# Patient Record
Sex: Female | Born: 1955 | Race: White | Hispanic: No | Marital: Married | Smoking: Former smoker
Health system: Southern US, Community
[De-identification: ages and names within clinical notes are randomized; demographics above are authoritative.]

## PROBLEM LIST (undated history)

## (undated) DIAGNOSIS — F329 Major depressive disorder, single episode, unspecified: Secondary | ICD-10-CM

## (undated) DIAGNOSIS — F419 Anxiety disorder, unspecified: Secondary | ICD-10-CM

## (undated) DIAGNOSIS — F32A Depression, unspecified: Secondary | ICD-10-CM

## (undated) DIAGNOSIS — G4700 Insomnia, unspecified: Secondary | ICD-10-CM

## (undated) DIAGNOSIS — N950 Postmenopausal bleeding: Secondary | ICD-10-CM

## (undated) DIAGNOSIS — M8580 Other specified disorders of bone density and structure, unspecified site: Secondary | ICD-10-CM

## (undated) HISTORY — DX: Other specified disorders of bone density and structure, unspecified site: M85.80

## (undated) HISTORY — PX: HYSTEROSCOPY: SHX211

## (undated) HISTORY — DX: Postmenopausal bleeding: N95.0

## (undated) HISTORY — DX: Insomnia, unspecified: G47.00

## (undated) HISTORY — PX: TUBAL LIGATION: SHX77

## (undated) HISTORY — PX: AUGMENTATION MAMMAPLASTY: SUR837

## (undated) HISTORY — DX: Depression, unspecified: F32.A

## (undated) HISTORY — DX: Major depressive disorder, single episode, unspecified: F32.9

## (undated) HISTORY — DX: Anxiety disorder, unspecified: F41.9

---

## 1998-04-19 ENCOUNTER — Other Ambulatory Visit: Admission: RE | Admit: 1998-04-19 | Discharge: 1998-04-19 | Payer: Self-pay | Admitting: Obstetrics and Gynecology

## 2000-07-11 ENCOUNTER — Other Ambulatory Visit: Admission: RE | Admit: 2000-07-11 | Discharge: 2000-07-11 | Payer: Self-pay | Admitting: Obstetrics and Gynecology

## 2000-08-13 ENCOUNTER — Encounter (INDEPENDENT_AMBULATORY_CARE_PROVIDER_SITE_OTHER): Payer: Self-pay

## 2000-08-13 ENCOUNTER — Other Ambulatory Visit: Admission: RE | Admit: 2000-08-13 | Discharge: 2000-08-13 | Payer: Self-pay | Admitting: Obstetrics and Gynecology

## 2001-12-18 ENCOUNTER — Other Ambulatory Visit: Admission: RE | Admit: 2001-12-18 | Discharge: 2001-12-18 | Payer: Self-pay | Admitting: Obstetrics and Gynecology

## 2002-12-14 ENCOUNTER — Ambulatory Visit (HOSPITAL_BASED_OUTPATIENT_CLINIC_OR_DEPARTMENT_OTHER): Admission: RE | Admit: 2002-12-14 | Discharge: 2002-12-14 | Payer: Self-pay | Admitting: Obstetrics and Gynecology

## 2002-12-14 ENCOUNTER — Encounter (INDEPENDENT_AMBULATORY_CARE_PROVIDER_SITE_OTHER): Payer: Self-pay | Admitting: Specialist

## 2002-12-14 ENCOUNTER — Ambulatory Visit (HOSPITAL_COMMUNITY): Admission: RE | Admit: 2002-12-14 | Discharge: 2002-12-14 | Payer: Self-pay | Admitting: Obstetrics and Gynecology

## 2002-12-21 ENCOUNTER — Other Ambulatory Visit: Admission: RE | Admit: 2002-12-21 | Discharge: 2002-12-21 | Payer: Self-pay | Admitting: Obstetrics and Gynecology

## 2003-02-26 HISTORY — PX: AUGMENTATION MAMMAPLASTY: SUR837

## 2004-02-26 HISTORY — PX: TOTAL VAGINAL HYSTERECTOMY: SHX2548

## 2004-05-07 ENCOUNTER — Other Ambulatory Visit: Admission: RE | Admit: 2004-05-07 | Discharge: 2004-05-07 | Payer: Self-pay | Admitting: Gynecology

## 2004-06-06 ENCOUNTER — Encounter: Admission: RE | Admit: 2004-06-06 | Discharge: 2004-06-06 | Payer: Self-pay | Admitting: Gynecology

## 2004-06-12 ENCOUNTER — Observation Stay (HOSPITAL_COMMUNITY): Admission: AD | Admit: 2004-06-12 | Discharge: 2004-06-13 | Payer: Self-pay | Admitting: Gynecology

## 2004-06-12 ENCOUNTER — Encounter (INDEPENDENT_AMBULATORY_CARE_PROVIDER_SITE_OTHER): Payer: Self-pay | Admitting: *Deleted

## 2004-06-12 ENCOUNTER — Ambulatory Visit (HOSPITAL_BASED_OUTPATIENT_CLINIC_OR_DEPARTMENT_OTHER): Admission: RE | Admit: 2004-06-12 | Discharge: 2004-06-12 | Payer: Self-pay | Admitting: Gynecology

## 2005-04-02 ENCOUNTER — Encounter: Admission: RE | Admit: 2005-04-02 | Discharge: 2005-04-02 | Payer: Self-pay | Admitting: Gynecology

## 2005-05-15 ENCOUNTER — Other Ambulatory Visit: Admission: RE | Admit: 2005-05-15 | Discharge: 2005-05-15 | Payer: Self-pay | Admitting: Gynecology

## 2005-06-05 ENCOUNTER — Ambulatory Visit: Payer: Self-pay | Admitting: Hematology & Oncology

## 2006-06-05 ENCOUNTER — Other Ambulatory Visit: Admission: RE | Admit: 2006-06-05 | Discharge: 2006-06-05 | Payer: Self-pay | Admitting: Gynecology

## 2007-07-17 ENCOUNTER — Other Ambulatory Visit: Admission: RE | Admit: 2007-07-17 | Discharge: 2007-07-17 | Payer: Self-pay | Admitting: Gynecology

## 2007-08-03 ENCOUNTER — Encounter: Admission: RE | Admit: 2007-08-03 | Discharge: 2007-08-03 | Payer: Self-pay | Admitting: Gynecology

## 2008-08-05 ENCOUNTER — Other Ambulatory Visit: Admission: RE | Admit: 2008-08-05 | Discharge: 2008-08-05 | Payer: Self-pay | Admitting: Gynecology

## 2008-08-05 ENCOUNTER — Ambulatory Visit: Payer: Self-pay | Admitting: Gynecology

## 2008-08-05 ENCOUNTER — Encounter: Payer: Self-pay | Admitting: Gynecology

## 2008-08-23 ENCOUNTER — Ambulatory Visit: Payer: Self-pay | Admitting: Gynecology

## 2009-02-13 ENCOUNTER — Encounter: Admission: RE | Admit: 2009-02-13 | Discharge: 2009-02-13 | Payer: Self-pay | Admitting: Gynecology

## 2009-10-17 ENCOUNTER — Other Ambulatory Visit: Admission: RE | Admit: 2009-10-17 | Discharge: 2009-10-17 | Payer: Self-pay | Admitting: Gynecology

## 2009-10-17 ENCOUNTER — Ambulatory Visit: Payer: Self-pay | Admitting: Gynecology

## 2010-04-02 ENCOUNTER — Other Ambulatory Visit: Payer: Self-pay | Admitting: Gynecology

## 2010-04-03 ENCOUNTER — Ambulatory Visit (INDEPENDENT_AMBULATORY_CARE_PROVIDER_SITE_OTHER): Payer: Commercial Managed Care - PPO | Admitting: Gynecology

## 2010-04-03 DIAGNOSIS — N63 Unspecified lump in unspecified breast: Secondary | ICD-10-CM

## 2010-04-05 ENCOUNTER — Other Ambulatory Visit: Payer: Self-pay | Admitting: Gynecology

## 2010-04-05 DIAGNOSIS — N6312 Unspecified lump in the right breast, upper inner quadrant: Secondary | ICD-10-CM

## 2010-04-11 ENCOUNTER — Ambulatory Visit
Admission: RE | Admit: 2010-04-11 | Discharge: 2010-04-11 | Disposition: A | Discharge status: Home / Self Care | Payer: Commercial Managed Care - PPO | Source: Ambulatory Visit | Attending: Gynecology | Admitting: Gynecology

## 2010-04-11 ENCOUNTER — Other Ambulatory Visit: Payer: Self-pay | Admitting: Gynecology

## 2010-04-11 DIAGNOSIS — N6312 Unspecified lump in the right breast, upper inner quadrant: Secondary | ICD-10-CM

## 2010-04-26 ENCOUNTER — Other Ambulatory Visit (INDEPENDENT_AMBULATORY_CARE_PROVIDER_SITE_OTHER): Payer: Commercial Managed Care - PPO

## 2010-04-26 DIAGNOSIS — R630 Anorexia: Secondary | ICD-10-CM

## 2010-05-11 ENCOUNTER — Other Ambulatory Visit: Payer: Commercial Managed Care - PPO

## 2010-06-06 ENCOUNTER — Encounter: Payer: Commercial Managed Care - PPO | Attending: Gynecology | Admitting: *Deleted

## 2010-06-06 DIAGNOSIS — Z713 Dietary counseling and surveillance: Secondary | ICD-10-CM | POA: Insufficient documentation

## 2010-07-04 ENCOUNTER — Ambulatory Visit: Payer: Commercial Managed Care - PPO | Admitting: Gynecology

## 2010-07-09 ENCOUNTER — Ambulatory Visit (INDEPENDENT_AMBULATORY_CARE_PROVIDER_SITE_OTHER): Payer: Commercial Managed Care - PPO | Admitting: Gynecology

## 2010-07-09 DIAGNOSIS — G4700 Insomnia, unspecified: Secondary | ICD-10-CM

## 2010-07-09 DIAGNOSIS — R5381 Other malaise: Secondary | ICD-10-CM

## 2010-07-09 DIAGNOSIS — R636 Underweight: Secondary | ICD-10-CM

## 2010-07-13 NOTE — H&P (Signed)
[name], [name]               ACCOUNT NO.:  [id number]   MEDICAL RECORD NO.:  [id number]          PATIENT TYPE:  OBV   LOCATION:  0447                         FACILITY:  [facility], [place]   PHYSICIAN:  Juan H. Lily Peer, M.D.DATE OF BIRTH:  Oct 16, 1955   DATE OF ADMISSION:  06/12/2004  DATE OF DISCHARGE:                                HISTORY & PHYSICAL   Total days hospitalized: 24 hours.   HISTORY:  The patient is a 55 year old gravida 6 para 3 AB 3 who underwent a  transvaginal hysterectomy with bilateral salpingo-oophorectomy on June 12, 2004 secondary to postmenopausal bleeding and endometrial hyperplasia. The  patient had a blood loss from her surgery of 150 mL, IV fluids 1900 mL of  lactated Ringer's, urine output 350 mL and clear. The patient was kept on a  PCA pump overnight, along with a Foley catheter which was removed the  following morning. She was advanced from a clear liquid to a regular diet.  Her hemoglobin and hematocrit postoperative day #1 was 10.7 and 31.9  respectively with a platelet count of 255,000. The patient's diet was  advanced to a regular diet and by noon she had been tolerating a regular  diet, had showered, was ambulating, and was voiding well. She had great  bowel sounds and her abdomen was soft and nontender.   FINAL DIAGNOSES:  1.  Postmenopausal bleeding.  2.  Endometrial hyperplasia.   PROCEDURES PERFORMED:  Transvaginal hysterectomy with bilateral salpingo-  oophorectomy.   FINAL DISPOSITION AND FOLLOW-UP:  The patient was ready to be discharged  home 24 hours after her surgery, was doing well, ambulating, voiding well,  tolerating a regular diet, and had taken a shower, and was ready to be  discharged home. Pathology report pending at the time of this dictation. The  patient was given a prescription of Lortab 7.5/500 to take one p.o. q.4-6h.  p.r.n. pain, prescription for Reglan 10 mg to take one p.o. q.4-6h. p.r.n.  nausea, and iron  supplementation such as Nu-Iron 150 to take one p.o. daily.  She was scheduled to follow up in the office in 2-3 weeks for a  postoperative visit, and she will be placed on Climara transdermal estrogen  patch to apply 0.1 mg weekly.      JHF/MEDQ  D:  06/13/2004  T:  06/13/2004  Job:  [id number]

## 2010-07-13 NOTE — Op Note (Signed)
[name], CRAKER               ACCOUNT NO.:  [id number]   MEDICAL RECORD NO.:  [id number]          PATIENT TYPE:  AMB   LOCATION:  [facility]                         FACILITY:  [facility]   PHYSICIAN:  Juan H. Lily Peer, M.D.DATE OF BIRTH:  [birth year]   DATE OF PROCEDURE:  06/12/2004  DATE OF DISCHARGE:                                 OPERATIVE REPORT   SURGEON:  Juan H. Lily Peer, M.D.   FIRST ASSISTANT:  Douglass Rivers, MD   INDICATIONS FOR OPERATION:  A 55 year old gravida 6, para 3, AB 3 with  postmenopausal bleeding and history of endometrial hyperplasia.   POSTOPERATIVE DIAGNOSIS:  A 55 year old gravida 6, para 3, AB 3 with  postmenopausal bleeding and history of endometrial hyperplasia.   ANESTHESIA:  General endotracheal anesthesia.   PROCEDURE PERFORMED:  Transvaginal hysterectomy with bilateral salpingo-  oophorectomy.   FINDINGS:  Normal size, retroverted uterus, normal atrophic ovaries.   DESCRIPTION OF OPERATION:  After the patient was adequately counseled, she  was taken to the operating room where she underwent a successful general  endotracheal anesthesia.  She had received 2 g of cefotetan for prophylaxis  as well as she had PSA stockings for DVT prophylaxis.  She was placed in the  high lithotomy position.  The vagina and perineum were prepped and draped in  the usual sterile fashion.  A Foley catheter was inserted in effort to  monitor urinary output.  A short weighted speculum and two Deaver retractors  were placed in the vagina for exposure.  The anterior and posterior cervical  lip were grasped with Lahey thyroid clamps.  Xylocaine  2% with 1:100,000  epinephrine was infiltrated into the cervical vaginal fold for a total of 15  mL.  The cervical vaginal fold was incised in a circumferential fashion.  Posterior colpotomy was made, and a short weighted speculum was changed to a  long weighted billed speculum.  Both uterosacral ligaments were clamped,  cut, and  suture ligated with 0 Vicryl suture.  Anterior colpotomy was  accomplished, and remaining broad and cardinal ligaments were serially  clamped, cut, and suture ligated.  The posterior part of the uterus was  grasped with single-tooth tenaculum and everted in an effort to visualize  both pedicles which were respectively clamped, cut, and the uterus and  cervix were passed off the operative field.  Attention was placed to the  left tube and ovary which was grasped with a Babcock clamp and brought into  view, and left infundibular pelvic ligament was free tied with 0 Vicryl  suture followed by transfixation stitch of 0 Vicryl suture.  Similar  procedure was carried out on the contralateral side.  The posterior vaginal  peritoneum was base-balled from 3 to 9 o'clock position and following this,  after ascertaining adequate hemostasis, the vaginal cuff was closed with  interrupted sutures of 0 Vicryl suture.  The vagina was copiously  irrigated with normal saline solution.  Sponge count and needle count were  correct.  Iodoform gauze was placed into the vagina.  Blood loss for the  procedure was recorded as 150 mL, IV  fluid 1900 mL of lactated Ringer's, and  urine output 350 mL.      JHF/MEDQ  D:  06/12/2004  T:  06/12/2004  Job:  [id number]

## 2010-07-13 NOTE — Op Note (Signed)
NAME:  [name], [name]                         ACCOUNT NO.:  [id number]   MEDICAL RECORD NO.:  [id number]                   PATIENT TYPE:  AMB   LOCATION:  [facility]                                 FACILITY:  [facility]   PHYSICIAN:  Daniel L. Eda Paschal, M.D.           DATE OF BIRTH:  April 23, 1955   DATE OF PROCEDURE:  12/14/2002  DATE OF DISCHARGE:                                 OPERATIVE REPORT   PREOPERATIVE DIAGNOSIS:  Postmenopausal bleeding.   POSTOPERATIVE DIAGNOSES:  1. Postmenopausal bleeding.  2. Endometrial polyp.   OPERATION:  Hysteroscopy D&C.   SURGEON:  Daniel L. Eda Paschal, M.D.   ANESTHESIA:  General.   INDICATIONS:  The patient is a 55 year old, postmenopausal woman, who had  been on hormone replacement therapy but had used her patch unopposed with  Progestin for five months.  She came into the office.  Endometrial biopsy  was benign, but she was re-instructed to use it with progestin, and she  started to have persistent bleeding.  She was treated with Megace which  stopped the bleeding but as soon as she stopped the bleeding, she bled  again.  This happened twice.  She now enters the hospital for hysteroscopy  D&C with an endometrial polyp suspected.   FINDINGS:  External and vaginal was normal.  Cervix is clean.  Uterus is mid  position, normal size and shape with 1.5 degrees of descensus.  Adnexa are  palpably normal.  At the time of hysteroscopy, the patient had an  endometrial polyp on the posterior wall of the fundus.  It was less than 1  cm.  Once this had been removed, top of the fundus, tubal os, anterior and  posterior walls of the fundus, lower uterine segment, and endocervical canal  were free of any disease.   DESCRIPTION OF PROCEDURE:  After adequate general anesthesia, the patient  was placed in the dorsal lithotomy position, prepped and draped in the usual  sterile manner.  A single-tooth tenaculum was placed in the anterior lip of  the cervix.  The  cervix was dilated to a #21 Pratt dilator, and then a  hysteroscopic examination was done using the diagnostic hysteroscope, using  a camera for magnification, using 3% sorbitol to expand the intrauterine  cavity.  An excellent view was obtained.  There was a polypoid growth in the  posterior wall.  The hysteroscope was removed and using a sharp curette,  sharp curettage was done 360 degrees around with excision of the polyp.  The  patient was then re-hysteroscoped, and the polyp was gone.  Tissue was sent  to pathology for tissue diagnosis.  Estimated blood loss was less than 50 mL  with none replaced.  Fluid deficit was less than 100 mL.  The patient  tolerated the procedure well and left the operating room in satisfactory  condition.  Daniel L. Eda Paschal, M.D.    [name]  D:  12/14/2002  T:  12/14/2002  Job:  [id number]

## 2010-07-13 NOTE — H&P (Signed)
[name], [name]               ACCOUNT NO.:  [id number]   MEDICAL RECORD NO.:  [id number]          PATIENT TYPE:  AMB   LOCATION:  [facility]                         FACILITY:  [facility]   PHYSICIAN:  Juan H. Lily Peer, M.D.DATE OF BIRTH:  Jan 22, 1956   DATE OF ADMISSION:  06/12/2004  DATE OF DISCHARGE:                                HISTORY & PHYSICAL   CHIEF COMPLAINT:  Persistent postmenopausal bleeding, history of endometrial  hyperplasia as a result of unopposed estrogen.   HISTORY:  The patient is a 55 year old, gravida 6, para 3, AB 3, who was  seen in the office on May 07, 2004, for annual gynecological examination.  It appeared that the patient had been taking unopposed estrogen such as a  Vivelle patch 0.75 mg twice a week without adding the prometrium 100 mg  daily that had been prescribed.  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  The patient was to be  placed on Provera 10 mg 15 days of each month for three months with a  subsequent follow-up endometrial biopsy, but she continued to bleed.  She  was switched to Megace 20 mg b.i.d. and eventually to 40 mg b.i.d. and  continued to bleed, and so we will proceed with a transvaginal hysterectomy  with possible bilateral salpingo-oophorectomy.   PAST MEDICAL HISTORY:  The patient has had six pregnancies, three delivered  vaginally, three miscarriages.  She denies any allergies.  She has had a  history in the past of tubal ligation.  She has had D&C and hysteroscopy in  the past.  She has had a history of saline implants.  She is taking calcium  supplementation.   FAMILY HISTORY:  First cousin with breast cancer and father with lung  cancer.   PHYSICAL EXAMINATION:  GENERAL:  The patient weighs 114 pounds, 5 feet, 2.5  inches tall.  VITAL SIGNS:  Blood pressure 122/62.  HEENT:  Unremarkable.  NECK:   Supple.  Trachea midline.  No carotid bruits, no thyromegaly.  LUNGS:  Clear to auscultation without rhonchi or wheezes.  HEART:  Regular rate and rhythm, no murmurs or gallops.  BREAST EXAM:  Unremarkable, symmetrical in appearance, no skin  discoloration, no nipple inversion, no palpable masses or tenderness, no  supraclavicular or axillary lymphadenopathy.  ABDOMEN:  Soft, nontender, without rebound or guarding  PELVIC:  Bartholin, urethral, and Skene glands within normal limits.  Vagina  and cervix:  Normal without discharge.  Uterus:  Anteverted, normal size,  shape, and consistency.  Adnexa:  Without masses or tenderness.  RECTAL EXAM:  Hemoccult negative.   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  The patient continued to bleed despite different regimens of  progestational agent.  The patient is scheduled to undergo a transvaginal  hysterectomy with bilateral salpingo-oophorectomy.  The risks of the  operation were discussed including infection, although she will receive  prophylactic antibiotics,  the right for deep venous thrombosis, will have  PSA stockings for prophylaxis, the risk of trauma to the bladder,  intestines, blood vessels, rectum, and nearby structures were discussed.  In  the event of technical difficulty, an open laparotomy technique may need to  be utilized which may increase her hospitalization days, and also in the  event that the procedure is not able to be accomplished or completed  vaginally, an abdominal approach may need to be utilized, or  laparoscopically to free or remove the ovaries if not feasible or difficult  to remove vaginally.  Also, the risk for hemorrhage if she were need a blood  transfusion, she is fully aware of potential risks such as anaphylaxis,  hepatitis, and AIDS.  All of the above were discussed with the patient in   detail.  Literature and information from the [employer] of OB/GYN was  provided.  All questions were followed, and we will follow accordingly.   PLAN:  The patient was schedule for transvaginal hysterectomy with bilateral  salpingo-oophorectomy on Tuesday, June 12, 2004 at 1:30 p.m. at [facility].      JHF/MEDQ  D:  06/07/2004  T:  06/07/2004  Job:  [id number]

## 2010-10-22 ENCOUNTER — Ambulatory Visit (INDEPENDENT_AMBULATORY_CARE_PROVIDER_SITE_OTHER): Payer: Commercial Managed Care - PPO | Admitting: Gynecology

## 2010-10-22 ENCOUNTER — Encounter: Payer: Self-pay | Admitting: Gynecology

## 2010-10-22 ENCOUNTER — Other Ambulatory Visit (HOSPITAL_COMMUNITY)
Admission: RE | Admit: 2010-10-22 | Discharge: 2010-10-22 | Disposition: A | Discharge status: Home / Self Care | Payer: Commercial Managed Care - PPO | Source: Ambulatory Visit | Attending: Gynecology | Admitting: Gynecology

## 2010-10-22 DIAGNOSIS — F419 Anxiety disorder, unspecified: Secondary | ICD-10-CM

## 2010-10-22 DIAGNOSIS — F411 Generalized anxiety disorder: Secondary | ICD-10-CM

## 2010-10-22 DIAGNOSIS — J40 Bronchitis, not specified as acute or chronic: Secondary | ICD-10-CM | POA: Insufficient documentation

## 2010-10-22 DIAGNOSIS — M8580 Other specified disorders of bone density and structure, unspecified site: Secondary | ICD-10-CM

## 2010-10-22 DIAGNOSIS — Z01419 Encounter for gynecological examination (general) (routine) without abnormal findings: Secondary | ICD-10-CM

## 2010-10-22 DIAGNOSIS — Z131 Encounter for screening for diabetes mellitus: Secondary | ICD-10-CM

## 2010-10-22 DIAGNOSIS — Z1211 Encounter for screening for malignant neoplasm of colon: Secondary | ICD-10-CM

## 2010-10-22 DIAGNOSIS — N951 Menopausal and female climacteric states: Secondary | ICD-10-CM

## 2010-10-22 DIAGNOSIS — R634 Abnormal weight loss: Secondary | ICD-10-CM

## 2010-10-22 DIAGNOSIS — G4700 Insomnia, unspecified: Secondary | ICD-10-CM

## 2010-10-22 DIAGNOSIS — M949 Disorder of cartilage, unspecified: Secondary | ICD-10-CM

## 2010-10-22 DIAGNOSIS — R82998 Other abnormal findings in urine: Secondary | ICD-10-CM

## 2010-10-22 DIAGNOSIS — Z13220 Encounter for screening for lipoid disorders: Secondary | ICD-10-CM

## 2010-10-22 LAB — POC HEMOCCULT BLD/STL (OFFICE/1-CARD/DIAGNOSTIC): Fecal Occult Blood, POC: NEGATIVE

## 2010-10-22 MED ORDER — ESZOPICLONE 3 MG PO TABS: 3.0000 mg | ORAL_TABLET | Freq: Every day | ORAL | Status: DC

## 2010-10-22 MED ORDER — CEFDINIR 300 MG PO CAPS: 300.0000 mg | ORAL_CAPSULE | Freq: Two times a day (BID) | ORAL | Status: AC

## 2010-10-22 MED ORDER — ESTRATEST 1.25-2.5 MG PO TABS: 1.2500 | ORAL_TABLET | ORAL | Status: DC

## 2010-10-22 MED ORDER — ALPRAZOLAM 0.5 MG PO TABS: 0.5000 mg | ORAL_TABLET | Freq: Every evening | ORAL | Status: DC | PRN

## 2010-10-22 NOTE — Progress Notes (Signed)
[name] May 15, 1955 [id number]   History:    55 y.o.  for annual exam today. Patient had stated that she had been and an urgent care last week and was treated for a right lung pneumonia based on her chest x-ray findings. She had been placed on a Z-Pak for 5 days. She continues to have a nonproductive cough. Patient used to be a chronic smoker of 2 packs per day since she was 55 years old and stopped in 1999. Her brother was diagnosed with lung cancer as well as another brother with throat cancer both from being chronic smoker's. Review of her record indicating that patient was weighing 125 down to 118. Review of her records indicated in May of 2012 she had been placed on Megace DS 1 teaspoonful per day (5 cc) in an effort to improve her appetite and to gain weight. She had a normal TSH, competent is metabolic panel and CBC which were normal. She was weighing 1 pound 109 pounds and eventually increased to 125 pounds. Patient also has a history of osteopenia and review of her records indicated that she had been on Fosamax from 2006 2 2010 and is currently been on a drug-free holiday. She will need to schedule her next bone density study in the office. She is taking Caltrate plus one tablet twice a day with the addition of vitamin D 3 2000 units daily. Her last colonoscopy was less than 5 years ago which was normal. She has done well with her vasomotor symptoms on Estratest 1.25 mg daily. Patient had a TVH BSO in 2006 her last mammogram was in February 2012 which was normal and she states she does her monthly self breast examination. She suffers from insomnia at times and takes [place] on a when necessary basis and for anxiety she takes Xanax 0.5 mg when necessary.  Past medical history,surgical history, family history and social history were all reviewed and documented in the EPIC chart. ROS:  Was performed and pertinent positives and negatives are included in the history.  Exam: chaperone present Filed  Vitals:   10/22/10 1502  BP: 138/76   @WEIGHT @ Body mass index is 21.58 kg/(m^2).  General appearance : Well developed well nourished female. Skin grossly normal HEENT: Neck supple, trachea midline Lungs: Decreased breath sounds and upper lobes of the lungs with some rhonchi noted Heart: Regular rate and rhythm, no murmurs or gallops Breast:Examined in sitting and supine position were symmetrical in appearance, no palpable masses, to skin retraction, no nipple inversion, no nipple discharge and no axillary or supraclavicular lymphadenopathy Abdomen: no palpable masses or tenderness Pelvic  Ext/BUS/vagina  normal   Cervix : Absent Uterus : Absent,  vaginal cuff intact  Adnexa  Without masses or tenderness  Anus and perineum  normal   Rectovaginal  normal sphincter tone without palpated masses or tenderness             Hemoccult obtain results pending at time of this dictation     Assessment/Plan:  54 y.o. female for annual exam with evidence of bronchitis recently treated for pneumonia. She will be placed on Omnicef 300 mg one tablet twice a day for 10 days. Fecal occult blood testing was done today results pending at time of this dictation. She will schedule a bone density study here in the office the next couple weeks to follow the trend on her bone mineralization seen she's been on a drug-free holiday for the past 2 years from Fosamax. Prescription refill was given  for Estratest 1.25 mg daily for her vasomotor symptoms. Weekend to consider tapering her down next year. Once again the women's health initiative study were discussed as well as the risks benefits and pros and cons of hormone replacement therapy. She was given a prescription refill for the Lunesta 3 mg to take one by mouth each bedtime when necessary insomnia in a prescription refill for Xanax 0.5 mg to take one by mouth daily when necessary anxiety. Patient was instructed to continue her Caltrate plus twice a day as well as her  vitamin D 3 2000 units daily as well as to engage in weightbearing exercises 30-45 minutes 3-4 times a week for osteoporosis prevention. We will wait for the results of the bone density study and manage accordingly. She'll stop by the lab we'll check her CBC, cholesterol,, TSH, urinalysis, and random blood sugar. Pap smear was done today will notify her if there is any abnormality of any the above-mentioned tests otherwise will see her back in one year or when necessary. If her cough persisted despite being on the above antibiotic she will contact the office and we'll refer to the pulmonologist accordingly.    Ok Edwards MD, 10:26 PM 10/22/2010

## 2010-11-09 ENCOUNTER — Other Ambulatory Visit: Payer: Self-pay | Admitting: Gynecology

## 2010-12-02 ENCOUNTER — Other Ambulatory Visit: Payer: Self-pay | Admitting: Gynecology

## 2010-12-11 ENCOUNTER — Other Ambulatory Visit: Payer: Self-pay | Admitting: Gynecology

## 2010-12-12 ENCOUNTER — Ambulatory Visit (INDEPENDENT_AMBULATORY_CARE_PROVIDER_SITE_OTHER): Admission: RE | Admit: 2010-12-12 | Payer: Commercial Managed Care - PPO | Source: Ambulatory Visit

## 2010-12-12 DIAGNOSIS — M899 Disorder of bone, unspecified: Secondary | ICD-10-CM

## 2010-12-12 NOTE — Telephone Encounter (Signed)
RX CALLED INTO PHARMACY

## 2011-07-27 HISTORY — PX: BACK SURGERY: SHX140

## 2011-11-06 ENCOUNTER — Other Ambulatory Visit: Payer: Self-pay | Admitting: Gynecology

## 2011-11-06 ENCOUNTER — Telehealth: Payer: Self-pay | Admitting: *Deleted

## 2011-11-06 NOTE — Telephone Encounter (Signed)
Please check that the generic has not been discontinued. If it has been discontinued at customer care pharmacy she can pick up a 30 day supply for $20 or 90 day supply for $72 of the following:Triest 1.25 /methyltestosterone 1.25 and we can give her one year supply. Please let me know

## 2011-11-06 NOTE — Telephone Encounter (Signed)
Pharmacy faxed over a note stating that her estratest 1.25mg  has been discontinued. Please advise another RX for the patient. Thanks Sherrilyn Rist

## 2011-11-26 NOTE — Telephone Encounter (Signed)
Spoke with Pharmacy. They have the RX ready for the pt to pick up. KW

## 2011-12-27 ENCOUNTER — Other Ambulatory Visit: Payer: Self-pay | Admitting: Gynecology

## 2012-01-12 IMAGING — US US BREAST*R*
1 series · 4 of 4 positions shown · non-contrast
Comparison: 02/13/2009, 08/03/2007

CLINICAL DATA: Palpable area of concern right breast.  Due for
annual exam.

DIGITAL DIAGNOSTIC BILATERAL MAMMOGRAM WITH CAD AND RIGHT BREAST
ULTRASOUND:

[Series 1: us breast*right* · 4 of 4 slices shown]
[im 1/4]
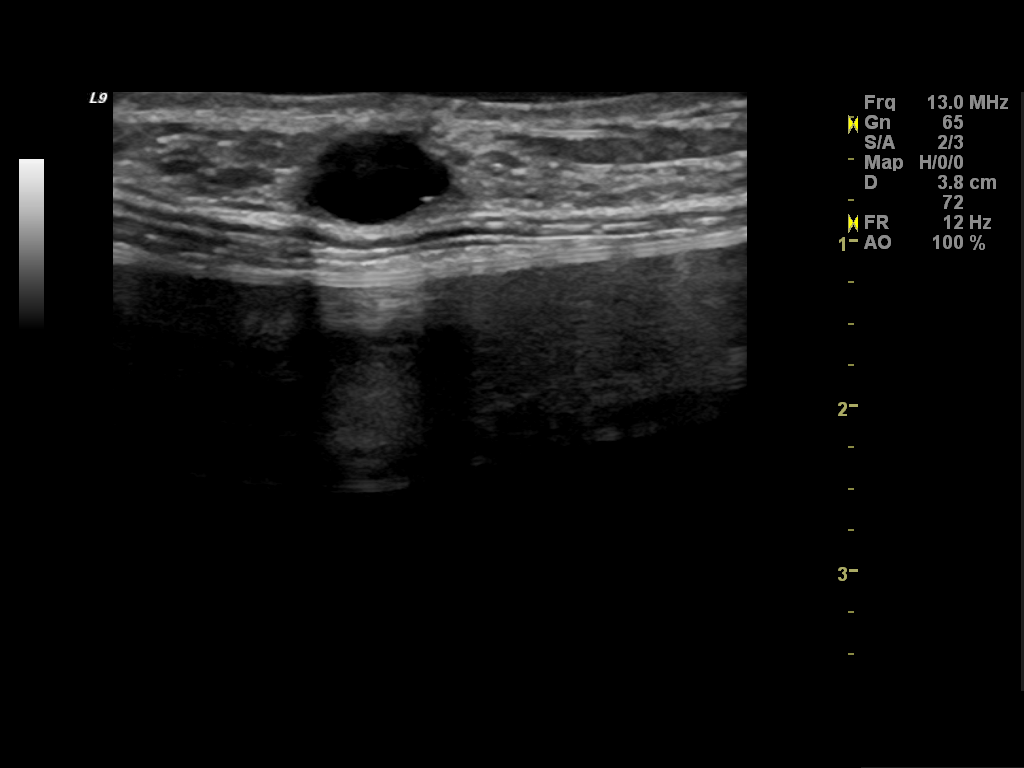
[im 2/4]
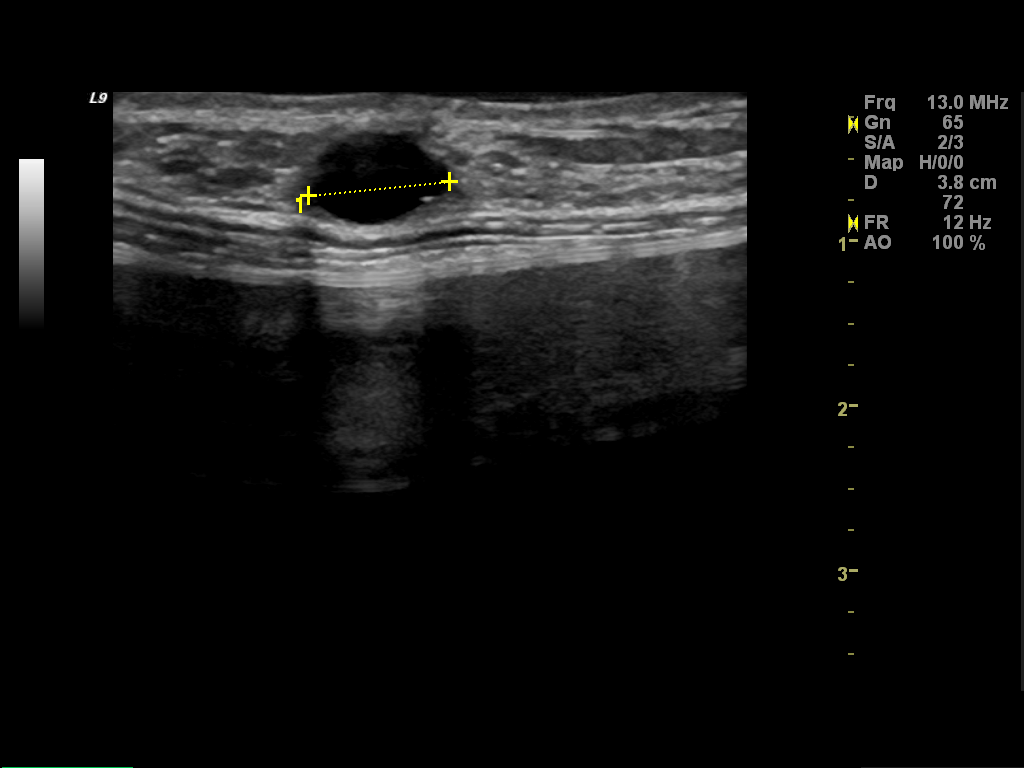
[im 3/4]
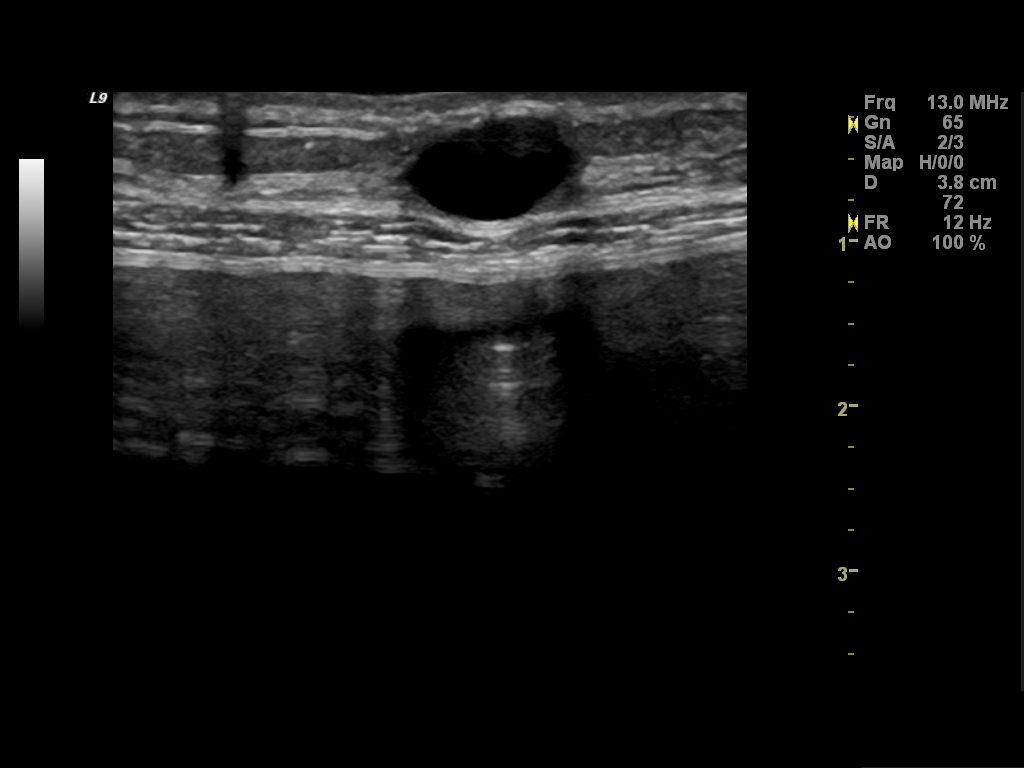
[im 4/4]
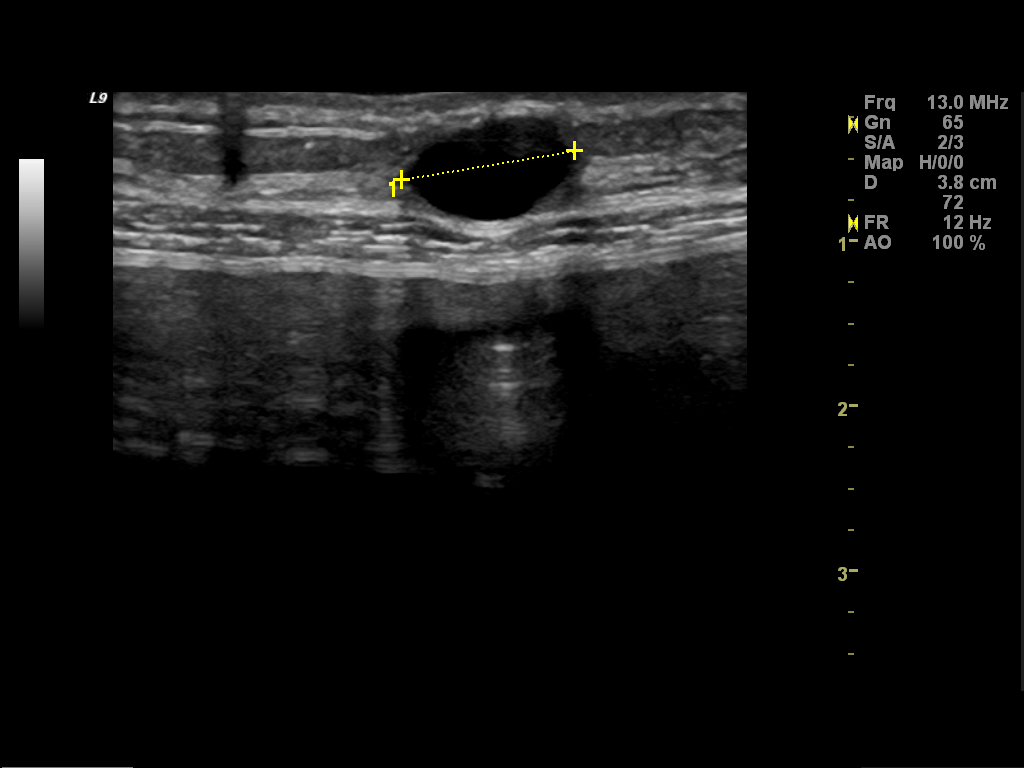

[4 of 4 positions shown; findings below may reference images not displayed]

FINDINGS: Bilateral subpectoral silicone breast implants appear
stable.  Overlying breast parenchyma is extremely dense.  There is
a vague rounded obscured area of added density immediately
subjacent to the skin marker.

Mammographic images were processed with CAD.

On physical exam, there is a discretely palpable mobile
superficially located 1 cm mass right breast one o'clock position 6
cm from the nipple.

Ultrasound is performed, showing a 1.1 cm simple cyst at the site
of palpable concern 1 o'clock position right breast.
IMPRESSION: No evidence of malignancy in either breast.  1.1 cm simple cyst 1
o'clock position right breast accounts for the palpable lump.
Bilateral screening mammogram in 1 year is recommended.

BI-RADS CATEGORY 2:  Benign finding(s).

## 2012-01-12 IMAGING — MG MM DIGITAL DIAGNOSTIC BILAT CAD
9 series · 9 of 9 positions shown · non-contrast
Comparison: 02/13/2009, 08/03/2007

CLINICAL DATA: Palpable area of concern right breast.  Due for
annual exam.

DIGITAL DIAGNOSTIC BILATERAL MAMMOGRAM WITH CAD AND RIGHT BREAST
ULTRASOUND:

[R CC]
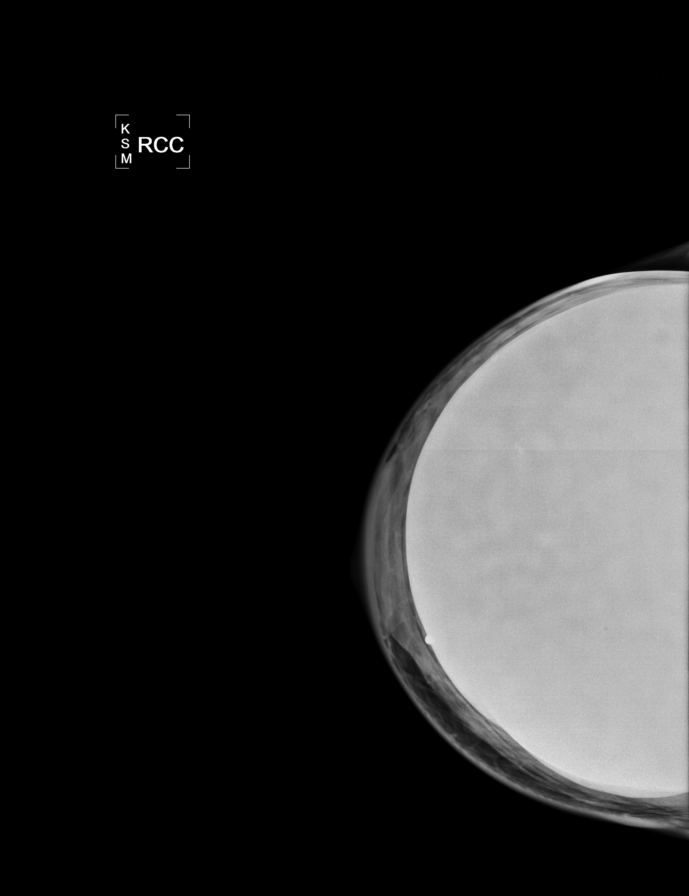

[R MLO]
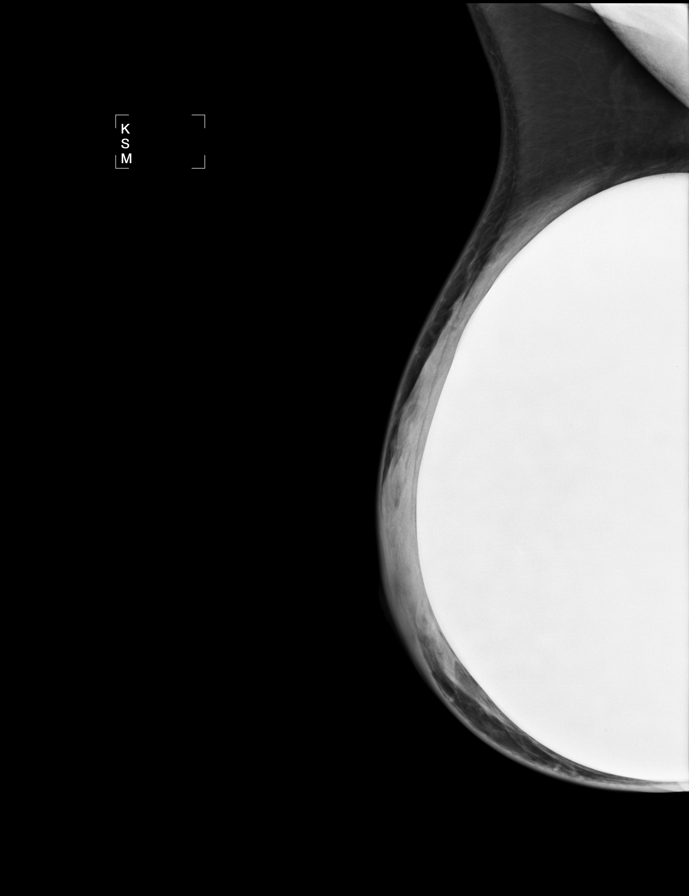

[R CCID]
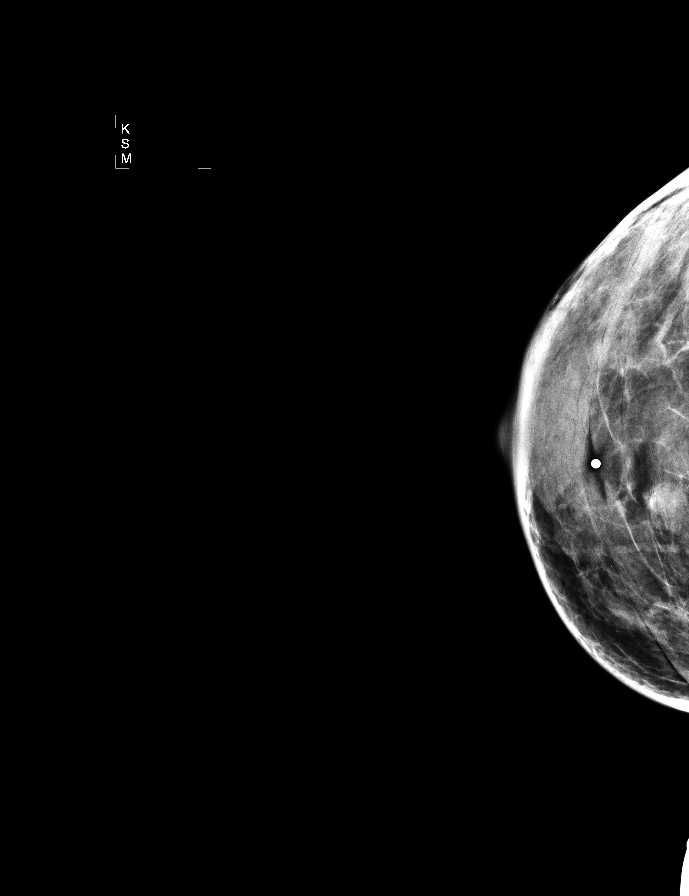

[R MLOID]
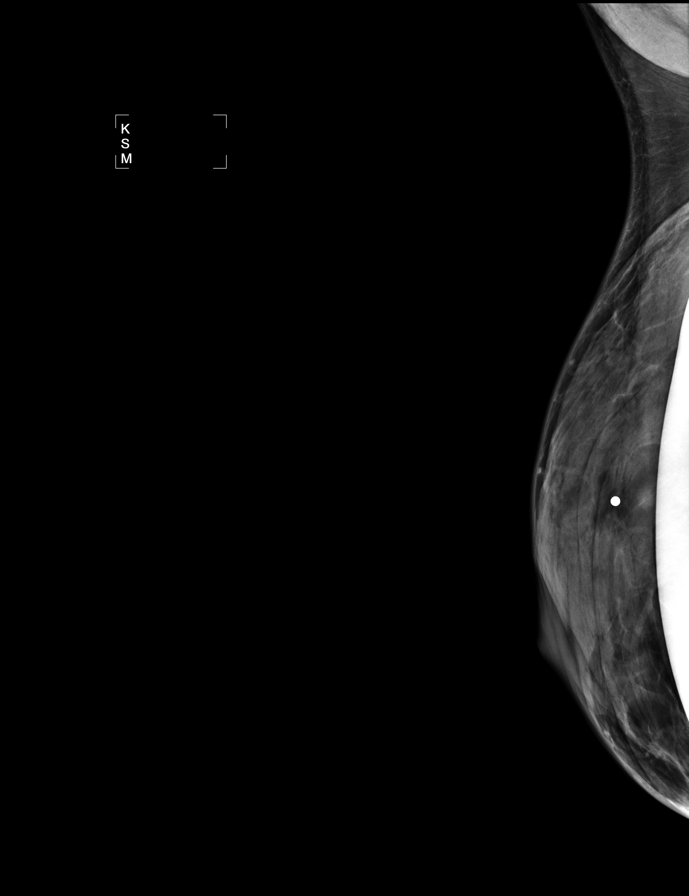

[L CC]
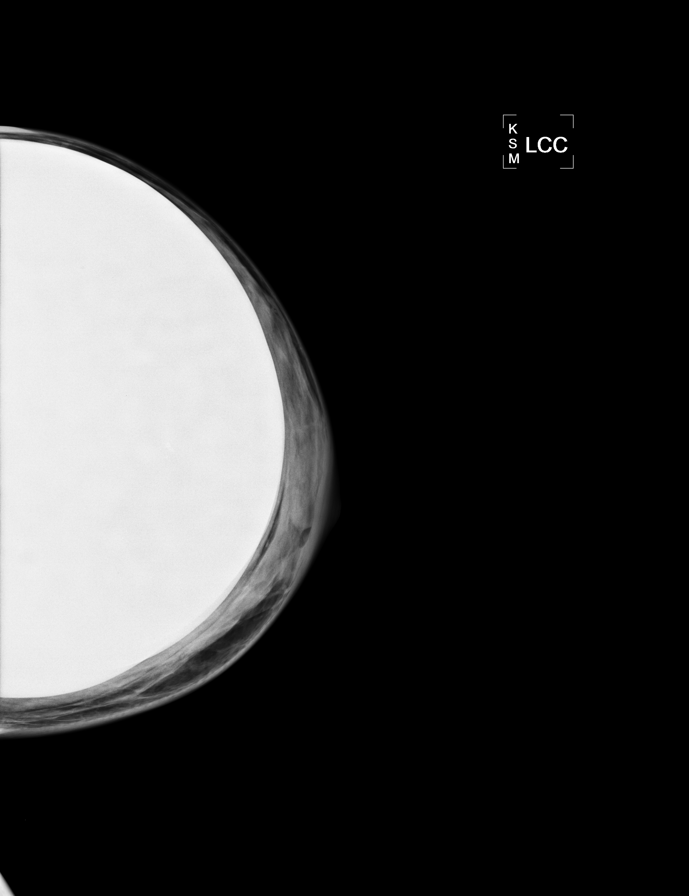

[L MLO (1 of 2)]
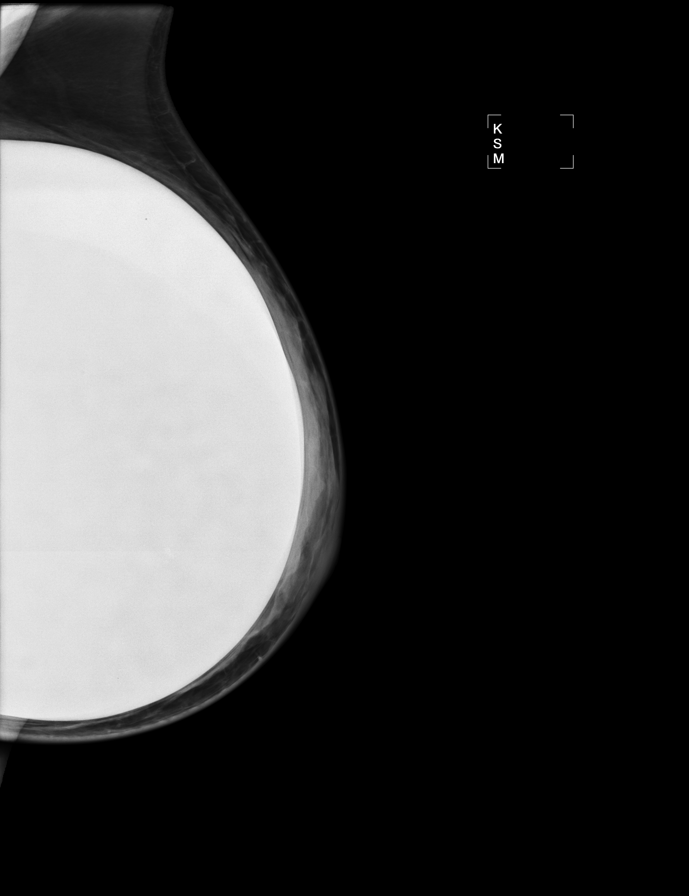

[L CCID]
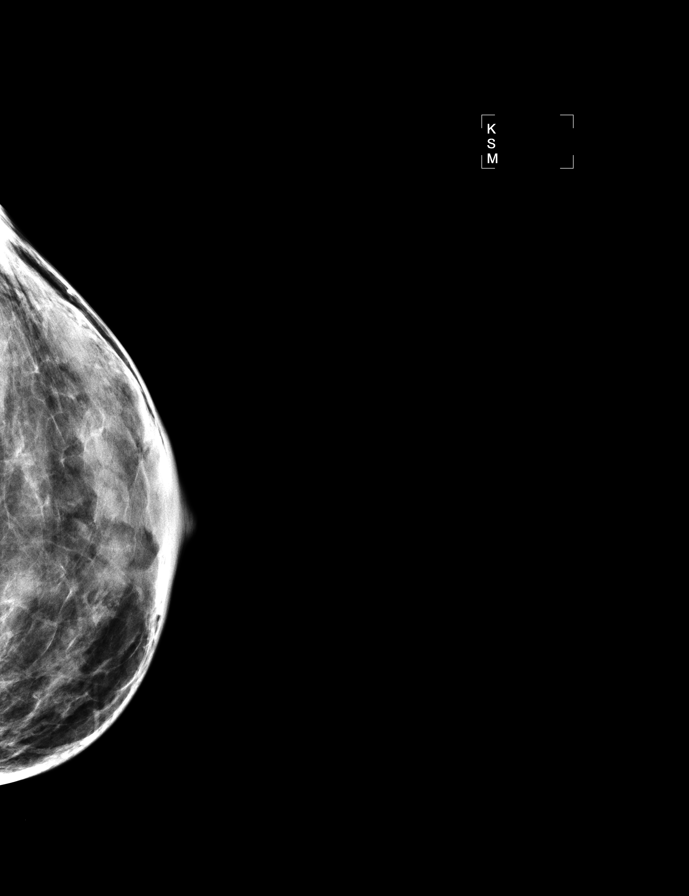

[L MLOID]
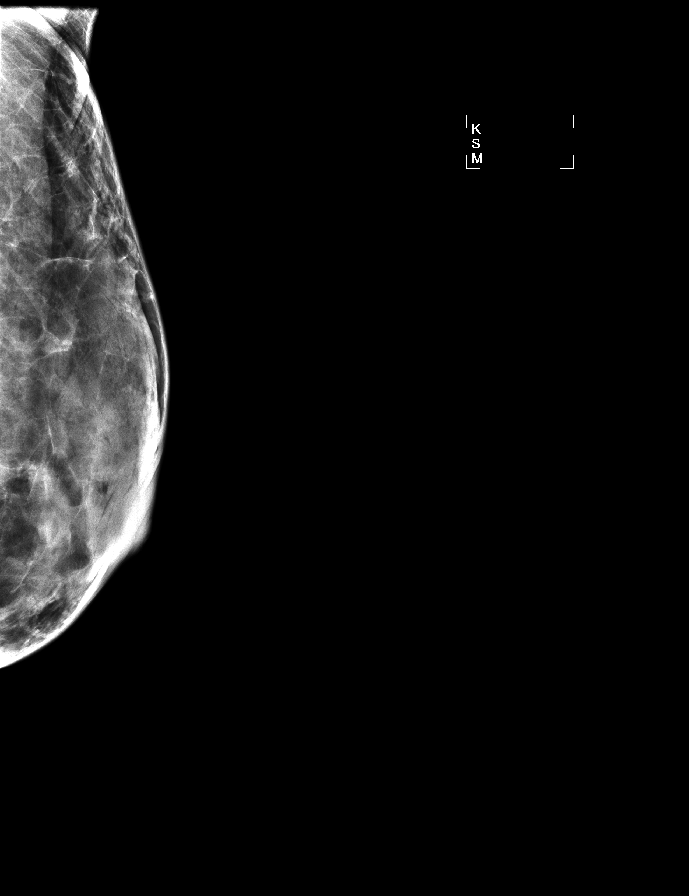

[L MLO (2 of 2)]
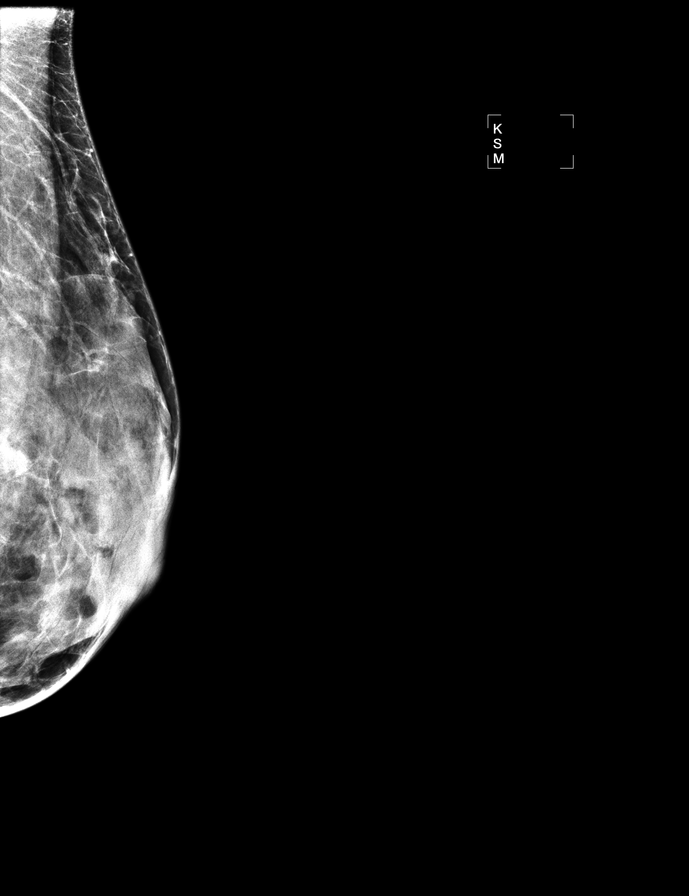

[9 of 9 positions shown; findings below may reference images not displayed]

FINDINGS: Bilateral subpectoral silicone breast implants appear
stable.  Overlying breast parenchyma is extremely dense.  There is
a vague rounded obscured area of added density immediately
subjacent to the skin marker.

Mammographic images were processed with CAD.

On physical exam, there is a discretely palpable mobile
superficially located 1 cm mass right breast one o'clock position 6
cm from the nipple.

Ultrasound is performed, showing a 1.1 cm simple cyst at the site
of palpable concern 1 o'clock position right breast.
IMPRESSION: No evidence of malignancy in either breast.  1.1 cm simple cyst 1
o'clock position right breast accounts for the palpable lump.
Bilateral screening mammogram in 1 year is recommended.

BI-RADS CATEGORY 2:  Benign finding(s).

## 2012-06-02 ENCOUNTER — Ambulatory Visit (INDEPENDENT_AMBULATORY_CARE_PROVIDER_SITE_OTHER): Payer: Commercial Managed Care - PPO | Admitting: Gynecology

## 2012-06-02 ENCOUNTER — Encounter: Payer: Self-pay | Admitting: Gynecology

## 2012-06-02 VITALS — BP 128/84 | Ht 61.75 in | Wt 129.0 lb

## 2012-06-02 DIAGNOSIS — F419 Anxiety disorder, unspecified: Secondary | ICD-10-CM

## 2012-06-02 DIAGNOSIS — Z1159 Encounter for screening for other viral diseases: Secondary | ICD-10-CM

## 2012-06-02 DIAGNOSIS — Z79890 Hormone replacement therapy (postmenopausal): Secondary | ICD-10-CM

## 2012-06-02 DIAGNOSIS — Z780 Asymptomatic menopausal state: Secondary | ICD-10-CM

## 2012-06-02 DIAGNOSIS — M949 Disorder of cartilage, unspecified: Secondary | ICD-10-CM

## 2012-06-02 DIAGNOSIS — M8580 Other specified disorders of bone density and structure, unspecified site: Secondary | ICD-10-CM

## 2012-06-02 DIAGNOSIS — Z23 Encounter for immunization: Secondary | ICD-10-CM

## 2012-06-02 DIAGNOSIS — F411 Generalized anxiety disorder: Secondary | ICD-10-CM

## 2012-06-02 DIAGNOSIS — Z01419 Encounter for gynecological examination (general) (routine) without abnormal findings: Secondary | ICD-10-CM

## 2012-06-02 LAB — CBC WITH DIFFERENTIAL/PLATELET
Basophils Absolute: 0 10*3/uL (ref 0.0–0.1)
Basophils Relative: 0 % (ref 0–1)
Hemoglobin: 13.1 g/dL (ref 12.0–15.0)
MCHC: 33.2 g/dL (ref 30.0–36.0)
Neutro Abs: 3.7 10*3/uL (ref 1.7–7.7)
Neutrophils Relative %: 58 % (ref 43–77)
RDW: 13.2 % (ref 11.5–15.5)

## 2012-06-02 LAB — COMPREHENSIVE METABOLIC PANEL
ALT: 53 U/L — ABNORMAL HIGH (ref 0–35)
AST: 33 U/L (ref 0–37)
Albumin: 4.6 g/dL (ref 3.5–5.2)
BUN: 16 mg/dL (ref 6–23)
Calcium: 10 mg/dL (ref 8.4–10.5)
Chloride: 105 mEq/L (ref 96–112)
Potassium: 4.4 mEq/L (ref 3.5–5.3)

## 2012-06-02 LAB — TSH: TSH: 1.999 u[IU]/mL (ref 0.350–4.500)

## 2012-06-02 MED ORDER — ALPRAZOLAM 0.5 MG PO TABS: ORAL_TABLET | ORAL | Status: DC

## 2012-06-02 MED ORDER — EST ESTROGENS-METHYLTEST 1.25-2.5 MG PO TABS: ORAL_TABLET | ORAL | Status: DC

## 2012-06-02 NOTE — Patient Instructions (Addendum)
Shingles Vaccine What You Need to Know WHAT IS SHINGLES?  Shingles is a painful skin rash, often with blisters. It is also called Herpes Zoster or just Zoster.  A shingles rash usually appears on one side of the face or body and lasts from 2 to 4 weeks. Its main symptom is pain, which can be quite severe. Other symptoms of shingles can include fever, headache, chills, and upset stomach. Very rarely, a shingles infection can lead to pneumonia, hearing problems, blindness, brain inflammation (encephalitis), or death.  For about 1 person in 5, severe pain can continue even after the rash clears up. This is called post-herpetic neuralgia.  Shingles is caused by the Varicella Zoster virus. This is the same virus that causes chickenpox. Only someone who has had a case of chickenpox or rarely, has gotten chickenpox vaccine, can get shingles. The virus stays in your body. It can reappear many years later to cause a case of shingles.  You cannot catch shingles from another person with shingles. However, a person who has never had chickenpox (or chickenpox vaccine) could get chickenpox from someone with shingles. This is not very common.  Shingles is far more common in people 50 and older than in younger people. It is also more common in people whose immune systems are weakened because of a disease such as cancer or drugs such as steroids or chemotherapy.  At least 1 million people get shingles per year in the [place]. SHINGLES VACCINE  A vaccine for shingles was licensed in 2006. In clinical trials, the vaccine reduced the risk of shingles by 50%. It can also reduce the pain in people who still get shingles after being vaccinated.  A single dose of shingles vaccine is recommended for adults 60 years of age and older. SOME PEOPLE SHOULD NOT GET SHINGLES VACCINE OR SHOULD WAIT A person should not get shingles vaccine if he or she:  Has ever had a life-threatening allergic reaction to gelatin, the  antibiotic neomycin, or any other component of shingles vaccine. Tell your caregiver if you have any severe allergies.  Has a weakened immune system because of current:  AIDS or another disease that affects the immune system.  Treatment with drugs that affect the immune system, such as prolonged use of high-dose steroids.  Cancer treatment, such as radiation or chemotherapy.  Cancer affecting the bone marrow or lymphatic system, such as leukemia or lymphoma.  Is pregnant, or might be pregnant. Women should not become pregnant until at least 4 weeks after getting shingles vaccine. Someone with a minor illness, such as a cold, may be vaccinated. Anyone with a moderate or severe acute illness should usually wait until he or she recovers before getting the vaccine. This includes anyone with a temperature of 101.3 F (38 C) or higher. WHAT ARE THE RISKS FROM SHINGLES VACCINE?  A vaccine, like any medicine, could possibly cause serious problems, such as severe allergic reactions. However, the risk of a vaccine causing serious harm, or death, is extremely small.  No serious problems have been identified with shingles vaccine. Mild Problems  Redness, soreness, swelling, or itching at the site of the injection (about 1 person in 3).  Headache (about 1 person in 70). Like all vaccines, shingles vaccine is being closely monitored for unusual or severe problems. WHAT IF THERE IS A MODERATE OR SEVERE REACTION? What should I look for? Any unusual condition, such as a severe allergic reaction or a high fever. If a severe allergic reaction   occurred, it would be within a few minutes to an hour after the shot. Signs of a serious allergic reaction can include difficulty breathing, weakness, hoarseness or wheezing, a fast heartbeat, hives, dizziness, paleness, or swelling of the throat. What should I do?  Call your caregiver, or get the person to a caregiver right away.  Tell the caregiver what  happened, the date and time it happened, and when the vaccination was given.  Ask the caregiver to report the reaction by filing a Vaccine Adverse Event Reporting System (VAERS) form. Or, you can file this report through the VAERS web site at www.vaers.LAgents.no or by calling 1-971-881-3948. VAERS does not provide medical advice. HOW CAN I LEARN MORE?  Ask your caregiver. He or she can give you the vaccine package insert or suggest other sources of information.  Contact the Centers for Disease Control and Prevention (CDC):  Call 212-073-1179 (1-800-CDC-INFO).  Visit the CDC website at PicCapture.uy CDC Shingles Vaccine VIS (12/01/07) Document Released: 12/09/2005 Document Revised: 05/06/2011 Document Reviewed: 12/01/2007 [facility] Patient Information 2013 [place], [place]. Tetanus, Diphtheria, Pertussis (Tdap) Vaccine What You Need to Know WHY GET VACCINATED? Tetanus, diphtheria and pertussis can be very serious diseases, even for adolescents and adults. Tdap vaccine can protect [place] from these diseases. TETANUS (Lockjaw) causes painful muscle tightening and stiffness, usually all over the body.  It can lead to tightening of muscles in the head and neck so you can't open your mouth, swallow, or sometimes even breathe. Tetanus kills about 1 out of 5 people who are infected. DIPHTHERIA can cause a thick coating to form in the back of the throat.  It can lead to breathing problems, paralysis, heart failure, and death. PERTUSSIS (Whooping Cough) causes severe coughing spells, which can cause difficulty breathing, vomiting and disturbed sleep.  It can also lead to weight loss, incontinence, and rib fractures. Up to 2 in 100 adolescents and 5 in 100 adults with pertussis are hospitalized or have complications, which could include pneumonia and death. These diseases are caused by bacteria. Diphtheria and pertussis are spread from person to person through coughing or sneezing. Tetanus enters  the body through cuts, scratches, or wounds. Before vaccines, the [place] saw as many as 200,000 cases a year of diphtheria and pertussis, and hundreds of cases of tetanus. Since vaccination began, tetanus and diphtheria have dropped by about 99% and pertussis by about 80%. TDAP VACCINE Tdap vaccine can protect adolescents and adults from tetanus, diphtheria, and pertussis. One dose of Tdap is routinely given at age 63 or 3. People who did not get Tdap at that age should get it as soon as possible. Tdap is especially important for health care professionals and anyone having close contact with a baby younger than 12 months. Pregnant women should get a dose of Tdap during every pregnancy, to protect the newborn from pertussis. Infants are most at risk for severe, life-threatening complications from pertussis. A similar vaccine, called Td, protects from tetanus and diphtheria, but not pertussis. A Td booster should be given every 10 years. Tdap may be given as one of these boosters if you have not already gotten a dose. Tdap may also be given after a severe cut or burn to prevent tetanus infection. Your doctor can give you more information. Tdap may safely be given at the same time as other vaccines. SOME PEOPLE SHOULD NOT GET THIS VACCINE  If you ever had a life-threatening allergic reaction after a dose of any tetanus, diphtheria, or pertussis containing  vaccine, OR if you have a severe allergy to any part of this vaccine, you should not get Tdap. Tell your doctor if you have any severe allergies.  If you had a coma, or long or multiple seizures within 7 days after a childhood dose of DTP or DTaP, you should not get Tdap, unless a cause other than the vaccine was found. You can still get Td.  Talk to your doctor if you:  have epilepsy or another nervous system problem,  had severe pain or swelling after any vaccine containing diphtheria, tetanus or pertussis,  ever had Guillain-Barr  Syndrome (GBS),  aren't feeling well on the day the shot is scheduled. RISKS OF A VACCINE REACTION With any medicine, including vaccines, there is a chance of side effects. These are usually mild and go away on their own, but serious reactions are also possible. Brief fainting spells can follow a vaccination, leading to injuries from falling. Sitting or lying down for about 15 minutes can help prevent these. Tell your doctor if you feel dizzy or light-headed, or have vision changes or ringing in the ears. Mild problems following Tdap (Did not interfere with activities)  Pain where the shot was given (about 3 in 4 adolescents or 2 in 3 adults)  Redness or swelling where the shot was given (about 1 person in 5)  Mild fever of at least 100.68F (up to about 1 in 25 adolescents or 1 in 100 adults)  Headache (about 3 or 4 people in 10)  Tiredness (about 1 person in 3 or 4)  Nausea, vomiting, diarrhea, stomach ache (up to 1 in 4 adolescents or 1 in 10 adults)  Chills, body aches, sore joints, rash, swollen glands (uncommon) Moderate problems following Tdap (Interfered with activities, but did not require medical attention)  Pain where the shot was given (about 1 in 5 adolescents or 1 in 100 adults)  Redness or swelling where the shot was given (up to about 1 in 16 adolescents or 1 in 25 adults)  Fever over 102F (about 1 in 100 adolescents or 1 in 250 adults)  Headache (about 3 in 20 adolescents or 1 in 10 adults)  Nausea, vomiting, diarrhea, stomach ache (up to 1 or 3 people in 100)  Swelling of the entire arm where the shot was given (up to about 3 in 100). Severe problems following Tdap (Unable to perform usual activities, required medical attention)  Swelling, severe pain, bleeding and redness in the arm where the shot was given (rare). A severe allergic reaction could occur after any vaccine (estimated less than 1 in a million doses). WHAT IF THERE IS A SERIOUS REACTION? What  should I look for?  Look for anything that concerns you, such as signs of a severe allergic reaction, very high fever, or behavior changes. Signs of a severe allergic reaction can include hives, swelling of the face and throat, difficulty breathing, a fast heartbeat, dizziness, and weakness. These would start a few minutes to a few hours after the vaccination. What should I do?  If you think it is a severe allergic reaction or other emergency that can't wait, call 9-1-1 or get the person to the nearest hospital. Otherwise, call your doctor.  Afterward, the reaction should be reported to the "Vaccine Adverse Event Reporting System" (VAERS). Your doctor might file this report, or you can do it yourself through the VAERS web site at www.vaers.SamedayNews.es, or by calling 913-238-6089. VAERS is only for reporting reactions. They do not give  medical advice.  THE NATIONAL VACCINE INJURY COMPENSATION PROGRAM The National Vaccine Injury Compensation Program (VICP) is a federal program that was created to compensate people who may have been injured by certain vaccines. Persons who believe they may have been injured by a vaccine can learn about the program and about filing a claim by calling 1-9416356901 or visiting the VICP website at SpiritualWord.at. HOW CAN I LEARN MORE?  Ask your doctor.  Call your local or state health department.  Contact the Centers for Disease Control and Prevention (CDC):  Call 737 293 6308 or visit CDC's website at PicCapture.uy CDC Tdap Vaccine VIS (07/04/11) Document Released: 08/13/2011 Document Reviewed: 08/13/2011 [facility] Patient Information 2013 [place], [place].

## 2012-06-02 NOTE — Progress Notes (Signed)
Called into pharmacy

## 2012-06-02 NOTE — Progress Notes (Signed)
[name] [birth year] [id number]   History:    57 y.o.  for annual gyn exam With no complaints today. Patient has a past history of osteopenia and review of her record indicated she had been on Fosamax from 2006 - 2010. She is currently taking calcium and vitamin D. She was a chronic smoker for many years and stopped in 1999. Patient has a prior history of transvaginal hysterectomy bilateral salpingo-oophorectomy. Patient with no prior history of abnormal Pap smears. Patient had a normal colonoscopy in 2007. She has bilateral silicon implants. Patient exam next 0.5 mg one tablet when necessary. She's also on Estratest 1.25 mg by mouth daily and has been off of it for several weeks and she states that she cannot tolerate the severity of the vasomotor symptoms affecting her quality of life. She has tried to taper off but has not been successful because of rebound symptoms. Patient's last bone density study was in 2012 with evidence of osteopenia. Patient mother with osteoporosis.  Past medical history,surgical history, family history and social history were all reviewed and documented in the EPIC chart.  Gynecologic History No LMP recorded. Patient has had a hysterectomy. Contraception: status post hysterectomy Last Pap: 2012. Results were: normal Last mammogram: 2012. Results were: normal  Obstetric History OB History   Grav Para Term Preterm Abortions TAB SAB Ect Mult Living   6 3   3  3   2      # Outc Date GA Lbr Len/2nd Wgt Sex Del Anes PTL Lv   1 PAR            2 PAR            3 PAR            4 SAB            5 SAB            6 SAB                ROS: A ROS was performed and pertinent positives and negatives are included in the history.  GENERAL: No fevers or chills. HEENT: No change in vision, no earache, sore throat or sinus congestion. NECK: No pain or stiffness. CARDIOVASCULAR: No chest pain or pressure. No palpitations. PULMONARY: No shortness of breath, cough or wheeze.  GASTROINTESTINAL: No abdominal pain, nausea, vomiting or diarrhea, melena or bright red blood per rectum. GENITOURINARY: No urinary frequency, urgency, hesitancy or dysuria. MUSCULOSKELETAL: No joint or muscle pain, no back pain, no recent trauma. DERMATOLOGIC: No rash, no itching, no lesions. ENDOCRINE: No polyuria, polydipsia, no heat or cold intolerance. No recent change in weight. HEMATOLOGICAL: No anemia or easy bruising or bleeding. NEUROLOGIC: No headache, seizures, numbness, tingling or weakness. PSYCHIATRIC: No depression, no loss of interest in normal activity or change in sleep pattern.     Exam: chaperone present  BP 128/84  Ht 5' 1.75" (1.568 m)  Wt 129 lb (58.514 kg)  BMI 23.8 kg/m2  Body mass index is 23.8 kg/(m^2).  General appearance : Well developed well nourished female. No acute distress HEENT: Neck supple, trachea midline, no carotid bruits, no thyroidmegaly Lungs: Clear to auscultation, no rhonchi or wheezes, or rib retractions  Heart: Regular rate and rhythm, no murmurs or gallops Breast:Examined in sitting and supine position were symmetrical in appearance, no palpable masses or tenderness,  no skin retraction, no nipple inversion, no nipple discharge, no skin discoloration, no axillary or supraclavicular lymphadenopathy Abdomen: no palpable masses  or tenderness, no rebound or guarding Extremities: no edema or skin discoloration or tenderness  Pelvic:  Bartholin, Urethra, Skene Glands: Within normal limits             Vagina: No gross lesions or discharge  Cervix: absent  Uterus  Absent  Adnexa  Without masses or tenderness  Anus and perineum  normal   Rectovaginal  normal sphincter tone without palpated masses or tenderness             Hemoccult cards provided     Assessment/Plan:  57 y.o. female for annual exam who has history of osteopenia currently on drug holiday for the past 4 years. She will schedule a bone density study in the next several weeks. She  will also schedule her mammogram which is overdue. She will receive the Tdap vaccine today. I've also given her prescription to obtain the shingles vaccine in her local pharmacy. Literature information was provided on both subject. The following labs were ordered today: Vitamin D, comprehensive metabolic panel, CBC, screening cholesterol, and urinalysis. Hemoccult cards provided for her to submit to the office for testing. No Pap smear done today the new guidelines were discussed. We discussed importance of calcium vitamin D with regular exercise for osteoporosis prevention. Patient last year had lumbar surgery for herniated disc.    Ok Edwards MD, 3:22 PM 06/02/2012

## 2012-06-02 NOTE — Addendum Note (Signed)
Addended by: Bertram Savin A on: 06/02/2012 03:33 PM   Modules accepted: Orders

## 2012-06-03 ENCOUNTER — Other Ambulatory Visit: Payer: Self-pay | Admitting: *Deleted

## 2012-06-03 DIAGNOSIS — R319 Hematuria, unspecified: Secondary | ICD-10-CM

## 2012-06-03 LAB — URINALYSIS W MICROSCOPIC + REFLEX CULTURE
Bacteria, UA: NONE SEEN
Ketones, ur: NEGATIVE mg/dL
Nitrite: NEGATIVE
Urobilinogen, UA: 0.2 mg/dL (ref 0.0–1.0)

## 2012-06-03 LAB — HEPATITIS C ANTIBODY: HCV Ab: NEGATIVE

## 2012-06-03 LAB — VITAMIN D 25 HYDROXY (VIT D DEFICIENCY, FRACTURES): Vit D, 25-Hydroxy: 49 ng/mL (ref 30–89)

## 2012-06-04 LAB — URINE CULTURE: Organism ID, Bacteria: NO GROWTH

## 2012-06-08 ENCOUNTER — Other Ambulatory Visit: Payer: Commercial Managed Care - PPO

## 2012-06-09 ENCOUNTER — Other Ambulatory Visit: Payer: Commercial Managed Care - PPO

## 2012-06-09 DIAGNOSIS — R319 Hematuria, unspecified: Secondary | ICD-10-CM

## 2012-06-09 LAB — AST: AST: 18 U/L (ref 0–37)

## 2012-06-09 LAB — ALT: ALT: 23 U/L (ref 0–35)

## 2012-06-10 LAB — URINALYSIS W MICROSCOPIC + REFLEX CULTURE
Casts: NONE SEEN
Glucose, UA: NEGATIVE mg/dL
Leukocytes, UA: NEGATIVE
Nitrite: NEGATIVE
pH: 6.5 (ref 5.0–8.0)

## 2013-01-06 ENCOUNTER — Other Ambulatory Visit: Payer: Self-pay | Admitting: Gynecology

## 2013-01-07 NOTE — Telephone Encounter (Signed)
rx called in [facility] 

## 2013-03-29 ENCOUNTER — Other Ambulatory Visit: Payer: Self-pay | Admitting: Gynecology

## 2013-03-29 NOTE — Telephone Encounter (Signed)
Rx called in 

## 2013-03-29 NOTE — Telephone Encounter (Signed)
Needs annual in April 

## 2013-07-26 ENCOUNTER — Other Ambulatory Visit: Payer: Self-pay | Admitting: Gynecology

## 2013-08-17 ENCOUNTER — Encounter: Payer: Self-pay | Admitting: Gynecology

## 2013-08-17 ENCOUNTER — Other Ambulatory Visit (HOSPITAL_COMMUNITY)
Admission: RE | Admit: 2013-08-17 | Discharge: 2013-08-17 | Disposition: A | Discharge status: Home / Self Care | Payer: Commercial Managed Care - PPO | Source: Ambulatory Visit | Attending: Gynecology | Admitting: Gynecology

## 2013-08-17 ENCOUNTER — Ambulatory Visit (INDEPENDENT_AMBULATORY_CARE_PROVIDER_SITE_OTHER): Payer: Commercial Managed Care - PPO | Admitting: Gynecology

## 2013-08-17 VITALS — BP 138/80 | Ht 61.75 in | Wt 122.0 lb

## 2013-08-17 DIAGNOSIS — Z01419 Encounter for gynecological examination (general) (routine) without abnormal findings: Secondary | ICD-10-CM | POA: Insufficient documentation

## 2013-08-17 DIAGNOSIS — M8580 Other specified disorders of bone density and structure, unspecified site: Secondary | ICD-10-CM

## 2013-08-17 DIAGNOSIS — M899 Disorder of bone, unspecified: Secondary | ICD-10-CM

## 2013-08-17 DIAGNOSIS — Z1151 Encounter for screening for human papillomavirus (HPV): Secondary | ICD-10-CM | POA: Insufficient documentation

## 2013-08-17 DIAGNOSIS — Z79890 Hormone replacement therapy (postmenopausal): Secondary | ICD-10-CM

## 2013-08-17 DIAGNOSIS — F411 Generalized anxiety disorder: Secondary | ICD-10-CM

## 2013-08-17 DIAGNOSIS — M949 Disorder of cartilage, unspecified: Secondary | ICD-10-CM

## 2013-08-17 DIAGNOSIS — F419 Anxiety disorder, unspecified: Secondary | ICD-10-CM

## 2013-08-17 MED ORDER — ALPRAZOLAM 0.5 MG PO TABS: 0.5000 mg | ORAL_TABLET | Freq: Every evening | ORAL | Status: AC | PRN

## 2013-08-17 MED ORDER — EST ESTROGENS-METHYLTEST 1.25-2.5 MG PO TABS: 1.0000 | ORAL_TABLET | Freq: Every day | ORAL | Status: DC

## 2013-08-17 NOTE — Progress Notes (Signed)
[name] [birth year] [id number]   History:    58 y.o.  for annual gyn exam with no complaints today.Patient has a past history of osteopenia and review of her record indicated she had been on Fosamax from 2006 - 2010. She is currently taking calcium and vitamin D. She was a chronic smoker for many years and stopped in 1999. Patient has a prior history of transvaginal hysterectomy (past history of endometrial hyperplasia) bilateral salpingo-oophorectomy. Patient with no prior history of abnormal Pap smears. Patient had a normal colonoscopy in 2007. She has bilateral silicon implants. Patient takes Xanax 0.5 mg when necessary anxiety.She's also on Estratest 1.25 mg by mouth daily and has been off of it for several weeks and she states that she cannot tolerate the severity of the vasomotor symptoms affecting her quality of life. She has tried to taper off but has not been successful because of rebound symptoms. Patient's last bone density study was in 2012 with evidence of osteopenia. Patient mother with osteoporosis.   Past medical history,surgical history, family history and social history were all reviewed and documented in the EPIC chart.  Gynecologic History No LMP recorded. Patient has had a hysterectomy. Contraception: status post hysterectomy Last Pap: 2012. Results were: normal Last mammogram: 2012. Results were: normal  Obstetric History OB History  Gravida Para Term Preterm AB SAB TAB Ectopic Multiple Living  6 3   3 3    2     # Outcome Date GA Lbr Len/2nd Weight Sex Delivery Anes PTL Lv  6 SAB           5 SAB           4 SAB           3 PAR           2 PAR           1 PAR                ROS: A ROS was performed and pertinent positives and negatives are included in the history.  GENERAL: No fevers or chills. HEENT: No change in vision, no earache, sore throat or sinus congestion. NECK: No pain or stiffness. CARDIOVASCULAR: No chest pain or pressure. No palpitations.  PULMONARY: No shortness of breath, cough or wheeze. GASTROINTESTINAL: No abdominal pain, nausea, vomiting or diarrhea, melena or bright red blood per rectum. GENITOURINARY: No urinary frequency, urgency, hesitancy or dysuria. MUSCULOSKELETAL: No joint or muscle pain, no back pain, no recent trauma. DERMATOLOGIC: No rash, no itching, no lesions. ENDOCRINE: No polyuria, polydipsia, no heat or cold intolerance. No recent change in weight. HEMATOLOGICAL: No anemia or easy bruising or bleeding. NEUROLOGIC: No headache, seizures, numbness, tingling or weakness. PSYCHIATRIC: No depression, no loss of interest in normal activity or change in sleep pattern.     Exam: chaperone present  BP 138/80  Ht 5' 1.75" (1.568 m)  Wt 122 lb (55.339 kg)  BMI 22.51 kg/m2  Body mass index is 22.51 kg/(m^2).  General appearance : Well developed well nourished female. No acute distress HEENT: Neck supple, trachea midline, no carotid bruits, no thyroidmegaly Lungs: Clear to auscultation, no rhonchi or wheezes, or rib retractions  Heart: Regular rate and rhythm, no murmurs or gallops Breast:Examined in sitting and supine position were symmetrical in appearance, no palpable masses or tenderness,  no skin retraction, no nipple inversion, no nipple discharge, no skin discoloration, no axillary or supraclavicular lymphadenopathy Abdomen: no palpable masses or tenderness, no rebound  or guarding Extremities: no edema or skin discoloration or tenderness  Pelvic:  Bartholin, Urethra, Skene Glands: Within normal limits             Vagina: No gross lesions or discharge, vaginal atrophy  Cervix: Absent  Uterus  Absent  Adnexa  Without masses or tenderness  Anus and perineum  normal   Rectovaginal  normal sphincter tone without palpated masses or tenderness             Hemoccult cards provided     Assessment/Plan:  58 y.o. female for annual exam doing well on her hormone replacement therapy. Patient with history of  osteopenia currently on year 5 of drug quality. She will schedule a bone density study. She will also schedule her overdue mammogram. She received her Tdap vaccine last year. I had previously given her a prescription to obtain her shingles vaccine. Her PCP will be drawn her blood work. Pap smear was done today. We discussed the importance of calcium and vitamin D in regular exercise for osteoporosis prevention. She was provided with fecal Hemoccult cards to submit to the office for testing. She was given a requisition to schedule her mammogram. A three-dimensional mammogram will be recommended due to the fact that she has silicone implants and has history of dense breast. She was reminded on the importance of monthly self breast exam.  Note: This dictation was prepared with  Dragon/digital dictation along withSmart phrase technology. Any transcriptional errors that result from this process are unintentional.   [name],Morgan H MD, 4:10 PM 08/17/2013

## 2013-08-17 NOTE — Patient Instructions (Signed)

## 2013-08-18 ENCOUNTER — Other Ambulatory Visit: Payer: Self-pay | Admitting: Gynecology

## 2013-08-19 ENCOUNTER — Telehealth: Payer: Self-pay | Admitting: *Deleted

## 2013-08-19 LAB — CYTOLOGY - PAP

## 2013-08-19 NOTE — Telephone Encounter (Signed)
Pt called stating Rx for 08/17/13 was never at the pharmacy both rx called in. Pt informed this has been done.

## 2013-12-27 ENCOUNTER — Encounter: Payer: Self-pay | Admitting: Gynecology

## 2014-03-08 ENCOUNTER — Other Ambulatory Visit: Payer: Self-pay | Admitting: Gynecology

## 2016-07-10 ENCOUNTER — Encounter: Payer: Self-pay | Admitting: Gynecology

## 2021-03-05 ENCOUNTER — Other Ambulatory Visit: Payer: Self-pay | Admitting: Specialist

## 2021-03-05 DIAGNOSIS — Z1231 Encounter for screening mammogram for malignant neoplasm of breast: Secondary | ICD-10-CM

## 2021-03-08 ENCOUNTER — Ambulatory Visit
Admission: RE | Admit: 2021-03-08 | Discharge: 2021-03-08 | Disposition: A | Discharge status: Home / Self Care | Payer: Medicare Other | Source: Ambulatory Visit | Attending: Specialist | Admitting: Specialist

## 2021-03-08 ENCOUNTER — Other Ambulatory Visit: Payer: Self-pay | Admitting: Specialist

## 2021-03-08 DIAGNOSIS — Z1231 Encounter for screening mammogram for malignant neoplasm of breast: Secondary | ICD-10-CM

## 2022-03-01 ENCOUNTER — Other Ambulatory Visit: Payer: Self-pay | Admitting: Specialist

## 2022-03-01 DIAGNOSIS — Z1231 Encounter for screening mammogram for malignant neoplasm of breast: Secondary | ICD-10-CM

## 2022-04-23 ENCOUNTER — Ambulatory Visit
Admission: RE | Admit: 2022-04-23 | Discharge: 2022-04-23 | Disposition: A | Discharge status: Home / Self Care | Payer: Medicare Other | Source: Ambulatory Visit | Attending: Specialist | Admitting: Specialist

## 2022-04-23 DIAGNOSIS — Z1231 Encounter for screening mammogram for malignant neoplasm of breast: Secondary | ICD-10-CM

## 2023-04-22 ENCOUNTER — Other Ambulatory Visit: Payer: Self-pay | Admitting: Specialist

## 2023-04-22 DIAGNOSIS — Z1231 Encounter for screening mammogram for malignant neoplasm of breast: Secondary | ICD-10-CM

## 2023-04-30 ENCOUNTER — Ambulatory Visit: Payer: Medicare Other

## 2023-05-01 ENCOUNTER — Ambulatory Visit
Admission: RE | Admit: 2023-05-01 | Discharge: 2023-05-01 | Disposition: A | Discharge status: Home / Self Care | Source: Ambulatory Visit | Attending: Specialist | Admitting: Specialist

## 2023-05-01 DIAGNOSIS — Z1231 Encounter for screening mammogram for malignant neoplasm of breast: Secondary | ICD-10-CM
